# Patient Record
Sex: Female | Born: 1945 | Race: White | Hispanic: No | Marital: Married | Smoking: Never smoker
Health system: Southern US, Community
[De-identification: ages and names within clinical notes are randomized; demographics above are authoritative.]

## PROBLEM LIST (undated history)

## (undated) DIAGNOSIS — N6099 Unspecified benign mammary dysplasia of unspecified breast: Principal | ICD-10-CM

## (undated) DIAGNOSIS — I10 Essential (primary) hypertension: Secondary | ICD-10-CM

## (undated) HISTORY — PX: BREAST LUMPECTOMY: SHX2

## (undated) HISTORY — DX: Unspecified benign mammary dysplasia of unspecified breast: N60.99

## (undated) HISTORY — DX: Essential (primary) hypertension: I10

---

## 2002-05-11 ENCOUNTER — Other Ambulatory Visit: Admission: RE | Admit: 2002-05-11 | Discharge: 2002-05-11 | Payer: Self-pay | Admitting: *Deleted

## 2002-05-11 ENCOUNTER — Encounter: Admission: RE | Admit: 2002-05-11 | Discharge: 2002-05-11 | Payer: Self-pay | Admitting: *Deleted

## 2002-07-16 ENCOUNTER — Ambulatory Visit (HOSPITAL_COMMUNITY): Admission: RE | Admit: 2002-07-16 | Discharge: 2002-07-16 | Payer: Self-pay | Admitting: *Deleted

## 2002-12-03 ENCOUNTER — Observation Stay (HOSPITAL_COMMUNITY): Admission: RE | Admit: 2002-12-03 | Discharge: 2002-12-04 | Payer: Self-pay | Admitting: *Deleted

## 2002-12-03 ENCOUNTER — Encounter (INDEPENDENT_AMBULATORY_CARE_PROVIDER_SITE_OTHER): Payer: Self-pay | Admitting: Specialist

## 2003-05-30 ENCOUNTER — Encounter: Admission: RE | Admit: 2003-05-30 | Discharge: 2003-05-30 | Payer: Self-pay | Admitting: *Deleted

## 2004-06-19 ENCOUNTER — Encounter: Admission: RE | Admit: 2004-06-19 | Discharge: 2004-06-19 | Payer: Self-pay | Admitting: *Deleted

## 2005-11-11 ENCOUNTER — Encounter: Admission: RE | Admit: 2005-11-11 | Discharge: 2005-11-11 | Payer: Self-pay | Admitting: *Deleted

## 2006-06-14 ENCOUNTER — Other Ambulatory Visit: Admission: RE | Admit: 2006-06-14 | Discharge: 2006-06-14 | Payer: Self-pay | Admitting: Family Medicine

## 2006-11-16 ENCOUNTER — Encounter: Admission: RE | Admit: 2006-11-16 | Discharge: 2006-11-16 | Payer: Self-pay | Admitting: Obstetrics and Gynecology

## 2007-09-25 ENCOUNTER — Ambulatory Visit: Payer: Self-pay | Admitting: Surgery

## 2007-12-12 ENCOUNTER — Encounter: Admission: RE | Admit: 2007-12-12 | Discharge: 2007-12-12 | Payer: Self-pay | Admitting: Family Medicine

## 2007-12-26 ENCOUNTER — Encounter: Admission: RE | Admit: 2007-12-26 | Discharge: 2007-12-26 | Payer: Self-pay | Admitting: Family Medicine

## 2008-05-07 ENCOUNTER — Encounter: Admission: RE | Admit: 2008-05-07 | Discharge: 2008-07-17 | Payer: Self-pay | Admitting: Family Medicine

## 2008-07-03 ENCOUNTER — Encounter: Admission: RE | Admit: 2008-07-03 | Discharge: 2008-07-03 | Payer: Self-pay | Admitting: Family Medicine

## 2008-08-05 ENCOUNTER — Encounter: Admission: RE | Admit: 2008-08-05 | Discharge: 2008-08-05 | Payer: Self-pay | Admitting: Family Medicine

## 2008-08-20 ENCOUNTER — Encounter: Admission: RE | Admit: 2008-08-20 | Discharge: 2008-08-20 | Payer: Self-pay | Admitting: General Surgery

## 2008-08-22 ENCOUNTER — Encounter (INDEPENDENT_AMBULATORY_CARE_PROVIDER_SITE_OTHER): Payer: Self-pay | Admitting: General Surgery

## 2008-08-22 ENCOUNTER — Ambulatory Visit (HOSPITAL_BASED_OUTPATIENT_CLINIC_OR_DEPARTMENT_OTHER): Admission: RE | Admit: 2008-08-22 | Discharge: 2008-08-22 | Payer: Self-pay | Admitting: General Surgery

## 2008-08-22 ENCOUNTER — Encounter: Admission: RE | Admit: 2008-08-22 | Discharge: 2008-08-22 | Payer: Self-pay | Admitting: General Surgery

## 2009-01-29 ENCOUNTER — Encounter: Admission: RE | Admit: 2009-01-29 | Discharge: 2009-01-29 | Payer: Self-pay | Admitting: Family Medicine

## 2009-07-17 ENCOUNTER — Ambulatory Visit: Payer: Self-pay | Admitting: Oncology

## 2009-10-06 ENCOUNTER — Ambulatory Visit: Payer: Self-pay | Admitting: Oncology

## 2009-10-06 LAB — CBC WITH DIFFERENTIAL/PLATELET
BASO%: 0.3 % (ref 0.0–2.0)
Eosinophils Absolute: 0.2 10*3/uL (ref 0.0–0.5)
MCHC: 33.6 g/dL (ref 31.5–36.0)
MONO#: 0.6 10*3/uL (ref 0.1–0.9)
NEUT#: 5 10*3/uL (ref 1.5–6.5)
RBC: 4.01 10*6/uL (ref 3.70–5.45)
RDW: 13.7 % (ref 11.2–14.5)
WBC: 7.7 10*3/uL (ref 3.9–10.3)
lymph#: 1.9 10*3/uL (ref 0.9–3.3)
nRBC: 0 % (ref 0–0)

## 2009-10-06 LAB — COMPREHENSIVE METABOLIC PANEL
ALT: 24 U/L (ref 0–35)
AST: 20 U/L (ref 0–37)
CO2: 27 mEq/L (ref 19–32)
Calcium: 9.6 mg/dL (ref 8.4–10.5)
Chloride: 105 mEq/L (ref 96–112)
Creatinine, Ser: 0.88 mg/dL (ref 0.40–1.20)
Sodium: 141 mEq/L (ref 135–145)
Total Protein: 6.5 g/dL (ref 6.0–8.3)

## 2010-01-01 ENCOUNTER — Ambulatory Visit: Payer: Self-pay | Admitting: Oncology

## 2010-01-05 LAB — CBC WITH DIFFERENTIAL/PLATELET
Eosinophils Absolute: 0.1 10*3/uL (ref 0.0–0.5)
LYMPH%: 27.8 % (ref 14.0–49.7)
MONO#: 0.5 10*3/uL (ref 0.1–0.9)
NEUT#: 3.9 10*3/uL (ref 1.5–6.5)
Platelets: 287 10*3/uL (ref 145–400)
RBC: 4.09 10*6/uL (ref 3.70–5.45)
WBC: 6.2 10*3/uL (ref 3.9–10.3)
lymph#: 1.7 10*3/uL (ref 0.9–3.3)

## 2010-01-05 LAB — COMPREHENSIVE METABOLIC PANEL
ALT: 12 U/L (ref 0–35)
Albumin: 4.3 g/dL (ref 3.5–5.2)
CO2: 29 mEq/L (ref 19–32)
Calcium: 9.5 mg/dL (ref 8.4–10.5)
Chloride: 104 mEq/L (ref 96–112)
Glucose, Bld: 74 mg/dL (ref 70–99)
Potassium: 4.1 mEq/L (ref 3.5–5.3)
Sodium: 141 mEq/L (ref 135–145)
Total Bilirubin: 0.4 mg/dL (ref 0.3–1.2)
Total Protein: 6.8 g/dL (ref 6.0–8.3)

## 2010-03-16 ENCOUNTER — Encounter: Admission: RE | Admit: 2010-03-16 | Discharge: 2010-03-16 | Payer: Self-pay | Admitting: Family Medicine

## 2010-08-09 ENCOUNTER — Encounter: Payer: Self-pay | Admitting: Family Medicine

## 2010-08-10 ENCOUNTER — Encounter: Payer: Self-pay | Admitting: Family Medicine

## 2010-11-03 LAB — BASIC METABOLIC PANEL
BUN: 14 mg/dL (ref 6–23)
CO2: 28 mEq/L (ref 19–32)
Calcium: 9.8 mg/dL (ref 8.4–10.5)
Chloride: 103 mEq/L (ref 96–112)
Creatinine, Ser: 0.83 mg/dL (ref 0.4–1.2)
GFR calc Af Amer: >60 mL/min (ref >60)
GFR calc non Af Amer: >60 mL/min (ref >60)
Glucose, Bld: 110 mg/dL — ABNORMAL HIGH (ref 70–99)
Potassium: 4.2 mEq/L (ref 3.5–5.1)
Sodium: 141 mEq/L (ref 135–145)

## 2010-11-03 LAB — DIFFERENTIAL
Basophils Absolute: 0 10*3/uL (ref 0.0–0.1)
Basophils Relative: 1 % (ref 0–1)
Eosinophils Absolute: 0.1 10*3/uL (ref 0.0–0.7)
Neutro Abs: 7.1 10*3/uL (ref 1.7–7.7)
Neutrophils Relative %: 75 % (ref 43–77)

## 2010-11-03 LAB — CBC
MCHC: 34.3 g/dL (ref 30.0–36.0)
MCV: 91.2 fL (ref 78.0–100.0)
Platelets: 267 10*3/uL (ref 150–400)
RBC: 4.37 MIL/uL (ref 3.87–5.11)
WBC: 9.5 10*3/uL (ref 4.0–10.5)

## 2010-12-01 NOTE — Assessment & Plan Note (Signed)
OFFICE VISIT   [name], [name]  DOB:  27-Apr-1946                                       09/25/2007  CHART#:[id number]   REASON FOR VISIT:  Bilateral leg pain.   HISTORY:  This is a 65 year old female who I am seeing at the request of  Dr. Tania Ade for evaluation of bilateral foot pain and bilateral leg pain.  The patient states that this initially began in November and December  and has gotten worse.  She complains of soreness and achiness in the  posterior aspect of her bilateral thigh and calf.  She denies having a  significant amount of swelling.  She is aggravated by prolonged  standing.  Resting it provides relief.  She does not have any  ulcerations.  She has never had any problems like this before.   REVIEW OF SYSTEMS:  GENERAL:  Negative for fevers or chills.  CARDIAC:  Negative.  PULMONARY:  Negative.  GI:  Negative.  GU:  Negative.  VASCULAR:  Pain in the legs with walking.  NEURO:  Negative.  ORTHO:  Positive for arthritis, joint pain, and muscle pain.  PSYCH:  Negative.  ENT:  Negative.  HEME:  Negative.   PAST MEDICAL HISTORY:  Significant for hypertension and  hypercholesterolemia.   FAMILY HISTORY:  Noncontributory.   SOCIAL HISTORY:  She is married with two children.  She has a history of  smoking but does not currently smoke.  She does not drink.   MEDICATIONS:  1. Meloxicam 15 mg per day.  2. Crestor 10 mg per day.  3. Vitamin D 100 IU once daily.  4. Multivitamin.  5. Glucosamine.  6. Aspirin 81 mg per day.  7. Tart black cherry 100 mg twice daily.   ALLERGIES:  None.   PHYSICAL EXAMINATION:  Vital signs:  Heart rate is 85.  Blood pressure  is 128/85.  Respirations 16.  O2 sats are 98%.  General:  She is well-  appearing in no acute distress.  HEENT:  Normocephalic and atraumatic.  Cardiovascular:  Regular rate.  Pulmonary:  Respirations are nonlabored.  Abdomen is obese.  Extremities: Warm and well perfused.  She  has  palpable pedal pulses.  There are some telangiectasias.  No prominent  varicosities.  Minimal edema.  No ulceration.   DIAGNOSTIC STUDIES:  Patient had arterial duplex which shows no abnormal  arterial flow.  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  The saphenofemoral junction is competent bilaterally.   ASSESSMENT:  Bilateral leg pain.   PLAN:  By ultrasound, the patient has no evidence of arterial  insufficiency.  I did proceed with a venous duplex, even though she does  not have a significant amount of swelling, she does have some stigmata  of venous insufficiency.  On ultrasound, she was found to have  incompetent perforators in the distal aspect of the bilateral lower  extremity, left greater than right.  I do not think that this has  explained the full extent of her symptoms; however, it may be  contributing.  I have recommended that she be put in 20-30 mm  compression hose for a trial period of three months.  If she has benefit  from this, we can proceed with possibly venous ablation.  However, I do  not feel that this is the most likely culprit for her symptoms,  especially given that her venous disease is in her lower leg and she  experiences discomfort in the posterior aspect of her thighs as well as  her lower legs.  I will plan on seeing the patient back in three months  for further evaluation.   Jorge Ny, MD  Electronically Signed   VWB/MEDQ  D:  09/25/2007  T:  09/26/2007  Job:  [id number]   cc:   Dr. Belva Agee

## 2010-12-01 NOTE — Op Note (Signed)
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   POSTOPERATIVE DIAGNOSIS:  Left breast atypical ductal hyperplasia on  core biopsy.   PROCEDURE:  Left breast wire localization biopsy.   SURGEON:  Juanetta Gosling, MD   ASSISTANT:  None.   ANESTHESIA:  General.   FINDINGS:  Mammographic confirmation of clip removal.   SPECIMENS:  Left breast mass oriented to pathology.   ESTIMATED BLOOD LOSS:  Minimal.   COMPLICATIONS:  None.   DRAINS:  None.   DISPOSITION:  To recovery room in stable condition.   INDICATIONS:  [name] is a 65 year old female who 6 months ago  had a BI-RADS 3 mammogram.  She returned for followup and interval  decrease in the number of calcifications, and it was determined to be BI-  RADS 4.  This area was about 0.9 cm in size.  On biopsy, this showed  atypical ductal hyperplasia with calcifications.  She was referred to me  for evaluation for surgical excision of this area.  We discussed a left  breast wire localization biopsy.   PROCEDURE:  After informed consent was obtained, the patient first had a  wire placed in the lesion by Dr. Christiana Pellant.  I had the mammograms  in her report available to me prior to the procedure.  She was  administered 1 g of intravenous cefazolin.  She was then taken to the  operating room and placed under general anesthesia without complication.  Her left breast was then prepped and draped in a standard sterile  surgical fashion. A surgical time-out was then performed.   A curvilinear incision was then made in the upper outer  quadrant of her  left breast.  Dissection was carried out down to the level of the wire.  This was then pulled into the wound.  Electrocautery was then used to  remove the breast surrounding the wire and this was then removed.  This  was marked with  sutures for orientation.  Mammogram was taken in the  operating room with the Faxitron confirming the removal of the clip.  The Faxitron was not working and I was unable to send a picture to Dr.  Chilton Si to evaluate, but I was confident that I removed the clip and the  specimen and then chose not to keep her under anesthesia for a prolonged  period of time due to this.  This later was confirmed.  Hemostasis was  observed.  I then closed her dermis with a 4-0 Vicryl, the skin with a 4-  0 Monocryl in a subcuticular fashion.  Steri-Strips and sterile dressing  were placed over this.  A 10 mL of 0.25% Marcaine was infiltrated in the  wound after this.  She  tolerated this well and was extubated in the  operating room and transferred to the recovery room in stable condition.      Juanetta Gosling, MD  Electronically Signed     MCW/MEDQ  D:  08/22/2008  T:  08/23/2008  Job:  [id number]   cc:   Ernestina Penna, M.D.  Harrel Lemon, MD

## 2010-12-01 NOTE — Procedures (Signed)
LOWER EXTREMITY VENOUS REFLUX EXAM   INDICATION:  Lower extremity pain.   EXAM:  Using color-flow imaging and pulse Doppler spectral analysis, the  right and left common femoral, superficial femoral, popliteal, posterior  tibial, greater and lesser saphenous veins are evaluated.  There is no  evidence suggesting deep venous insufficiency in the right and left  lower extremities.   The right and left saphenofemoral junctions are competent.  The right  and left GSV are competent in the thigh with the left incompetent in the  calf.   The right and left proximal short saphenous veins demonstrate  competency.   GSV Diameter (used if found to be incompetent only)                                            Right    Left  Proximal Greater Saphenous Vein           cm       cm  Proximal-to-mid-thigh                     cm       cm  Mid thigh                                 cm       cm  Mid-distal thigh                          cm       cm  Distal thigh                              cm       cm  Knee                                      cm       cm   IMPRESSION:  1. The right and left greater saphenous veins show no reflux from knee      to groin; however, the left does show incompetence in the calf.  2. The right and left greater saphenous veins are not aneurysmal.  3. The right greater saphenous vein is not tortuous in the thigh, the      left is tortuous and moves out of the fascia mid-thigh.  4. The deep venous system is competent.  5. The right and left lesser saphenous veins are competent.  6. There is evidence of perforator incompetence in the right distal      calf and three incompetent perforators in the left mid-to-distal      calf.   ___________________________________________  V. Charlena Cross, MD   AS/MEDQ  D:  09/25/2007  T:  09/25/2007  Job:  [id number]

## 2011-02-04 ENCOUNTER — Other Ambulatory Visit: Payer: Self-pay | Admitting: Oncology

## 2011-02-04 ENCOUNTER — Encounter (HOSPITAL_BASED_OUTPATIENT_CLINIC_OR_DEPARTMENT_OTHER): Payer: PRIVATE HEALTH INSURANCE | Admitting: Oncology

## 2011-02-04 DIAGNOSIS — Z803 Family history of malignant neoplasm of breast: Secondary | ICD-10-CM

## 2011-02-04 LAB — CBC WITH DIFFERENTIAL/PLATELET
Basophils Absolute: 0 10*3/uL (ref 0.0–0.1)
Eosinophils Absolute: 0.2 10*3/uL (ref 0.0–0.5)
HGB: 13.2 g/dL (ref 11.6–15.9)
MONO#: 0.6 10*3/uL (ref 0.1–0.9)
MONO%: 8.1 % (ref 0.0–14.0)
NEUT#: 4.5 10*3/uL (ref 1.5–6.5)
RBC: 4.16 10*6/uL (ref 3.70–5.45)
RDW: 13.6 % (ref 11.2–14.5)
WBC: 8 10*3/uL (ref 3.9–10.3)
lymph#: 2.6 10*3/uL (ref 0.9–3.3)

## 2011-02-04 LAB — COMPREHENSIVE METABOLIC PANEL
Albumin: 4.1 g/dL (ref 3.5–5.2)
Alkaline Phosphatase: 39 U/L (ref 39–117)
CO2: 28 mEq/L (ref 19–32)
Calcium: 9.2 mg/dL (ref 8.4–10.5)
Chloride: 106 mEq/L (ref 96–112)
Glucose, Bld: 87 mg/dL (ref 70–99)
Potassium: 4 mEq/L (ref 3.5–5.3)
Sodium: 140 mEq/L (ref 135–145)
Total Protein: 6.4 g/dL (ref 6.0–8.3)

## 2011-02-11 ENCOUNTER — Encounter (HOSPITAL_BASED_OUTPATIENT_CLINIC_OR_DEPARTMENT_OTHER): Payer: PRIVATE HEALTH INSURANCE | Admitting: Oncology

## 2011-02-11 DIAGNOSIS — N6089 Other benign mammary dysplasias of unspecified breast: Secondary | ICD-10-CM

## 2011-02-11 DIAGNOSIS — Z803 Family history of malignant neoplasm of breast: Secondary | ICD-10-CM

## 2011-03-30 ENCOUNTER — Other Ambulatory Visit: Payer: Self-pay | Admitting: Internal Medicine

## 2011-03-30 DIAGNOSIS — Z1231 Encounter for screening mammogram for malignant neoplasm of breast: Secondary | ICD-10-CM

## 2011-04-13 ENCOUNTER — Other Ambulatory Visit: Payer: Self-pay | Admitting: Oncology

## 2011-04-13 ENCOUNTER — Ambulatory Visit
Admission: RE | Admit: 2011-04-13 | Discharge: 2011-04-13 | Disposition: A | Discharge status: Home / Self Care | Payer: PRIVATE HEALTH INSURANCE | Source: Ambulatory Visit | Attending: Internal Medicine | Admitting: Internal Medicine

## 2011-04-13 DIAGNOSIS — Z1231 Encounter for screening mammogram for malignant neoplasm of breast: Secondary | ICD-10-CM

## 2011-06-24 ENCOUNTER — Telehealth: Payer: Self-pay | Admitting: *Deleted

## 2011-06-24 NOTE — Telephone Encounter (Signed)
patient confirmed over the phone the new date and tim on 02-11-2012 starting at 2:00pm

## 2011-08-19 ENCOUNTER — Encounter: Payer: Self-pay | Admitting: *Deleted

## 2011-08-19 ENCOUNTER — Telehealth: Payer: Self-pay | Admitting: *Deleted

## 2011-08-19 ENCOUNTER — Other Ambulatory Visit: Payer: Self-pay | Admitting: Oncology

## 2011-08-19 MED ORDER — TAMOXIFEN CITRATE 20 MG PO TABS: 20.0000 mg | ORAL_TABLET | Freq: Every day | ORAL | Status: DC

## 2011-08-19 NOTE — Progress Notes (Unsigned)
Spoke with Pt who states " I am in the middle of buying a house at [facility], [place] and I cant see Dr. Welton Flakes on 08/24/11. I wont be back until 02/08/12 when my appt is already scheduled. I cannot afford Evista due to no insurance and cost is 200$ month. " Pt has requested Generic.  Spoke with [facility] at [facility] [phone] and there is no Generic for Evista. Recommended Boniva cost is approx 115.59$ month or Faosamax at 9$ month S/W Jill [facility] at [facility] who recommended Fosamax as an alternative also.  Will review with MD

## 2011-08-19 NOTE — Telephone Encounter (Signed)
Spoke with Pt who states " I am in the middle of buying a house at [facility], [place] and I cant see Dr. Welton Flakes on 08/24/11. I wont be back until 02/08/12 when my appt is already scheduled. I cannot afford Evista due to no insurance and cost is 200$ month. " Pt has requested Generic.  Spoke with [facility] at [facility] [phone] and there is no Generic for Evista. Recommended Boniva cost is approx 115.59$ month or Faosamax at 9$ month S/W Jill [facility] at [facility] who recommended Fosamax as an alternative also.  Will review with MD    Onc Tx Schedule  Sent to cancel pt appt 08/24/11 per pt request

## 2011-08-19 NOTE — Telephone Encounter (Signed)
patient confirmed appointment for 10-01-2011 over the phone on 08-19-2011

## 2011-08-19 NOTE — Telephone Encounter (Signed)
patient confirmed over the phone the new date and time  on 09-2011 

## 2011-08-19 NOTE — Telephone Encounter (Signed)
Pt called with request to take Generic of Evista as she does not have insurance and cannot afford Evista 60mg  daily. Reviewed with MD- VO ok for pt to start generic of Evista- Raloxifene 60mg  Daily. Pt to see MD on 08/24/11 at 11:30.  Onc Tx Schd sent

## 2011-08-19 NOTE — Telephone Encounter (Signed)
per patient will not be back into [place] until march 2013

## 2011-08-19 NOTE — Telephone Encounter (Signed)
Patient call my nurse earlier today regarding the use of Evista. She has just lost her insurance and Evista is called costing her $200 a month for her  prescription. She she wanted to go to a generic form. Unfortunately Evista does not exist in the generic form. Patient is quite aware of this.  I spoke to the patient just now she is being treated with Evista for chemoprevention and not for osteoporosis. Her other option if she cannot afford Evista would be tamoxifen 20 mg daily. His consent benefits of tamoxifen were discussed with the patient and very detail including but not limited to endometrial carcinoma DVTs hot flashes liver problems etc. Patient understands and she certainly would like to switch to tamoxifen if it is going to cause to her last period I will have my nurse sent in a prescription to her pharmacy in [place]. Patient will [place] coming back to [place] in may of March and she will need to be seen at that time. I will send a electronic by mouth well to the schedulers. Patient will also call us if any questions or concerns arise.  Drue Second, MD Medical/Oncology [facility] [phone] (beeper) [phone] (Office)  08/19/2011, 5:29 PM

## 2011-08-20 ENCOUNTER — Other Ambulatory Visit: Payer: Self-pay

## 2011-08-20 DIAGNOSIS — C50919 Malignant neoplasm of unspecified site of unspecified female breast: Secondary | ICD-10-CM

## 2011-08-20 MED ORDER — TAMOXIFEN CITRATE 20 MG PO TABS: 20.0000 mg | ORAL_TABLET | Freq: Every day | ORAL | Status: AC

## 2011-08-24 ENCOUNTER — Ambulatory Visit: Payer: PRIVATE HEALTH INSURANCE | Admitting: Oncology

## 2011-09-16 ENCOUNTER — Other Ambulatory Visit: Payer: Self-pay | Admitting: *Deleted

## 2011-09-30 ENCOUNTER — Encounter: Payer: Self-pay | Admitting: *Deleted

## 2011-10-01 ENCOUNTER — Encounter: Payer: Self-pay | Admitting: Oncology

## 2011-10-01 ENCOUNTER — Ambulatory Visit (HOSPITAL_BASED_OUTPATIENT_CLINIC_OR_DEPARTMENT_OTHER): Payer: Self-pay | Admitting: Oncology

## 2011-10-01 ENCOUNTER — Telehealth: Payer: Self-pay | Admitting: Oncology

## 2011-10-01 VITALS — BP 138/78 | HR 66 | Temp 98.5°F | Ht 69.0 in | Wt 232.3 lb

## 2011-10-01 DIAGNOSIS — N6099 Unspecified benign mammary dysplasia of unspecified breast: Secondary | ICD-10-CM

## 2011-10-01 DIAGNOSIS — N6089 Other benign mammary dysplasias of unspecified breast: Secondary | ICD-10-CM

## 2011-10-01 HISTORY — DX: Unspecified benign mammary dysplasia of unspecified breast: N60.99

## 2011-10-01 NOTE — Telephone Encounter (Signed)
Last note entered under the wrong pt for 10/01/2011 gve the pt her oct 2013 appt calendar along with the mammo appt

## 2011-10-01 NOTE — Telephone Encounter (Signed)
gve the pt her April-oct 2013 appt calendar along with the ct scan appt with instructions

## 2011-10-01 NOTE — Patient Instructions (Signed)
1. You are doing well with the tamoxifen.  2. You are due for mammogram in October andI have ordered it for 04/18/12 as well as blood work.  3. I will see you back in April 21, 2012

## 2011-10-06 NOTE — Progress Notes (Signed)
OFFICE PROGRESS NOTE  CC Dr. Emelia Loron Dr. Rudi Heap  DIAGNOSIS: 66 year old female with atypical ductal hyperplasia of the left breast status post lumpectomy by Dr. Emelia Loron  PRIOR THERAPY:  #1 patient underwent a lumpectomy in [place] 2010 that showed an atypical ductal hyperplasia of the left breast.  #2 she was then started on Evista 60 mg daily unfortunately due to the cost patient could not afford this.  #3 patient was then started on tamoxifen 20 mg daily as a chemopreventive starting in February 2013.  CURRENT THERAPY:tamoxifen 20 mg daily for a duration of 5 years  INTERVAL HISTORY: Sheri [name] 66 y.o. female returns for followup visit. Clinically she seems to be doing well she is tolerating the tamoxifen well and has no ongoing side effects except for some hot flashes. She is denying any nausea vomiting fevers chills night sweats headaches she has no shortness of breath no chest pains no palpitations. 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 However she will continue to see [place] here every 6 months. Remainder of the 10 point review of systems is negative.  MEDICAL HISTORY: Past Medical History  Diagnosis Date  . Atypical ductal hyperplasia of breast   . Hypertension   . Atypical ductal hyperplasia of breast 10/01/2011    ALLERGIES:   has no known allergies.  MEDICATIONS:  Current Outpatient Prescriptions  Medication Sig Dispense Refill  . aspirin 81 MG tablet Take 81 mg by mouth daily.      . ASTAXANTHIN PO Take by mouth.      . calcium citrate (CALCITRATE - DOSED IN MG ELEMENTAL CALCIUM) 950 MG tablet Take 1 tablet by mouth daily.      . celecoxib (CELEBREX) 200 MG capsule Take 200 mg by mouth daily.      . cholecalciferol (VITAMIN D) 400 UNITS TABS Take 2,000 Units by mouth daily.      Marland Kitchen escitalopram (LEXAPRO) 20 MG tablet Take 20 mg by mouth daily.      . Fenofibrate 150 MG CAPS Take by mouth.       . moexipril-hydrochlorothiazide (UNIRETIC) 15-25 MG per tablet Take 1 tablet by mouth daily.      . rosuvastatin (CRESTOR) 10 MG tablet Take 10 mg by mouth daily.      . tamoxifen (NOLVADEX) 20 MG tablet Take 20 mg by mouth daily.        SURGICAL HISTORY:  Past Surgical History  Procedure Date  . Breast lumpectomy     REVIEW OF SYSTEMS:  Pertinent items are noted in HPI.   PHYSICAL EXAMINATION: General appearance: alert, cooperative and appears stated age Neck: no adenopathy, no carotid bruit, no JVD, supple, symmetrical, trachea midline and thyroid not enlarged, symmetric, no tenderness/mass/nodules Lymph nodes: Cervical, supraclavicular, and axillary nodes normal. Resp: clear to auscultation bilaterally Back: symmetric, no curvature. ROM normal. No CVA tenderness. Cardio: regular rate and rhythm, S1, S2 normal, no murmur, click, rub or gallop GI: soft, non-tender; bowel sounds normal; no masses,  no organomegaly Extremities: extremities normal, atraumatic, no cyanosis or edema Neurologic: Grossly normal Bilateral breast examination is performed there are no masses or nipple discharge or skin changes in the right breast left breast reveals a well-healed lumpectomy scar it is barely visible there are no masses or nipple discharge. ECOG PERFORMANCE STATUS: 0 - Asymptomatic  Blood pressure 138/78, pulse 66, temperature 98.5 F (36.9 C), temperature source Oral, height 5\' 9"  (1.753 m), weight  232 lb 4.8 oz (105.371 kg).  LABORATORY DATA: Lab Results  Component Value Date   WBC 8.0 02/04/2011   HGB 13.2 02/04/2011   HCT 38.9 02/04/2011   MCV 93.6 02/04/2011   PLT 272 02/04/2011      Chemistry      Component Value Date/Time   NA 140 02/04/2011 1418   NA 140 02/04/2011 1418   K 4.0 02/04/2011 1418   K 4.0 02/04/2011 1418   CL 106 02/04/2011 1418   CL 106 02/04/2011 1418   CO2 28 02/04/2011 1418   CO2 28 02/04/2011 1418   BUN 20 02/04/2011 1418   BUN 20 02/04/2011 1418   CREATININE  0.87 02/04/2011 1418   CREATININE 0.87 02/04/2011 1418      Component Value Date/Time   CALCIUM 9.2 02/04/2011 1418   CALCIUM 9.2 02/04/2011 1418   ALKPHOS 39 02/04/2011 1418   ALKPHOS 39 02/04/2011 1418   AST 16 02/04/2011 1418   AST 16 02/04/2011 1418   ALT 11 02/04/2011 1418   ALT 11 02/04/2011 1418   BILITOT 0.4 02/04/2011 1418   BILITOT 0.4 02/04/2011 1418       RADIOGRAPHIC STUDIES:  No results found.  ASSESSMENT: 66 year old female with  #1 atypical ductal hyperplasia of the left breast status post lumpectomy performed in February 2010 by Dr. Emelia Loron.  #2 patient is on tamoxifen 20 mg daily as a chemopreventive and she is tolerating this very well.   PLAN:   #1 we will continue tamoxifen on a daily basis for a total of 5 years.  #2 patient knows to call me with any problems questions or concerns.   All questions were answered. The patient knows to call the clinic with any problems, questions or concerns. We can certainly see the patient much sooner if necessary.  I spent 25 minutes counseling the patient face to face. The total time spent in the appointment was 30 minutes.    Drue Second, MD Medical/Oncology [facility] [phone] (beeper) [phone] (Office)  10/06/2011, 6:10 PM

## 2012-02-11 ENCOUNTER — Ambulatory Visit: Payer: PRIVATE HEALTH INSURANCE | Admitting: Oncology

## 2012-02-11 ENCOUNTER — Other Ambulatory Visit: Payer: PRIVATE HEALTH INSURANCE | Admitting: Lab

## 2012-04-18 ENCOUNTER — Ambulatory Visit
Admission: RE | Admit: 2012-04-18 | Discharge: 2012-04-18 | Disposition: A | Discharge status: Home / Self Care | Payer: Self-pay | Source: Ambulatory Visit | Attending: Oncology | Admitting: Oncology

## 2012-04-18 ENCOUNTER — Other Ambulatory Visit: Payer: Self-pay | Admitting: Oncology

## 2012-04-18 DIAGNOSIS — N6099 Unspecified benign mammary dysplasia of unspecified breast: Secondary | ICD-10-CM

## 2012-04-21 ENCOUNTER — Telehealth: Payer: Self-pay | Admitting: *Deleted

## 2012-04-21 ENCOUNTER — Encounter: Payer: Self-pay | Admitting: Oncology

## 2012-04-21 ENCOUNTER — Ambulatory Visit (HOSPITAL_BASED_OUTPATIENT_CLINIC_OR_DEPARTMENT_OTHER): Payer: Self-pay | Admitting: Oncology

## 2012-04-21 ENCOUNTER — Other Ambulatory Visit (HOSPITAL_BASED_OUTPATIENT_CLINIC_OR_DEPARTMENT_OTHER): Payer: Self-pay | Admitting: Lab

## 2012-04-21 VITALS — BP 158/92 | HR 69 | Temp 98.2°F | Resp 20 | Ht 69.0 in | Wt 231.8 lb

## 2012-04-21 DIAGNOSIS — N6099 Unspecified benign mammary dysplasia of unspecified breast: Secondary | ICD-10-CM

## 2012-04-21 DIAGNOSIS — N6089 Other benign mammary dysplasias of unspecified breast: Secondary | ICD-10-CM

## 2012-04-21 LAB — COMPREHENSIVE METABOLIC PANEL (CC13)
Albumin: 3.6 g/dL (ref 3.5–5.0)
BUN: 23 mg/dL (ref 7.0–26.0)
CO2: 24 mEq/L (ref 22–29)
Calcium: 8.8 mg/dL (ref 8.4–10.4)
Chloride: 106 mEq/L (ref 98–107)
Glucose: 110 mg/dl — ABNORMAL HIGH (ref 70–99)
Potassium: 4 mEq/L (ref 3.5–5.1)
Sodium: 139 mEq/L (ref 136–145)
Total Protein: 6.2 g/dL — ABNORMAL LOW (ref 6.4–8.3)

## 2012-04-21 LAB — CBC WITH DIFFERENTIAL/PLATELET
Basophils Absolute: 0 10*3/uL (ref 0.0–0.1)
Eosinophils Absolute: 0.2 10*3/uL (ref 0.0–0.5)
HCT: 36.1 % (ref 34.8–46.6)
HGB: 12.4 g/dL (ref 11.6–15.9)
LYMPH%: 29.1 % (ref 14.0–49.7)
MCV: 96.9 fL (ref 79.5–101.0)
MONO#: 0.5 10*3/uL (ref 0.1–0.9)
MONO%: 7.7 % (ref 0.0–14.0)
NEUT#: 3.7 10*3/uL (ref 1.5–6.5)
Platelets: 195 10*3/uL (ref 145–400)
WBC: 6.2 10*3/uL (ref 3.9–10.3)

## 2012-04-21 NOTE — Telephone Encounter (Signed)
Gave patient appointment for 10-20-2012 starting at 10:30am 

## 2012-04-21 NOTE — Patient Instructions (Addendum)
Continue tamoxifen as prescribed  I will see you back in 6 months

## 2012-04-21 NOTE — Telephone Encounter (Signed)
Gave patient appointment for 10-20-2012 starting at 10:30am

## 2012-04-21 NOTE — Progress Notes (Signed)
I check the patient in this morning and I notice that she does not have any insurance.I ask her if she would like to talk with the financial advocate and she said no.

## 2012-04-21 NOTE — Progress Notes (Signed)
OFFICE PROGRESS NOTE  CC Dr. Emelia Loron Dr. Rudi Heap  DIAGNOSIS: 66 year old female with atypical ductal hyperplasia of the left breast status post lumpectomy by Dr. Emelia Loron  PRIOR THERAPY:  #1 patient underwent a lumpectomy in [place] 2010 that showed an atypical ductal hyperplasia of the left breast.  #2 she was then started on Evista 60 mg daily unfortunately due to the cost patient could not afford this.  #3 patient was then started on tamoxifen 20 mg daily as a chemopreventive starting in February 2013.  CURRENT THERAPY:tamoxifen 20 mg daily for a duration of 5 years  INTERVAL HISTORY: Sheri [name] 66 y.o. female returns for followup visit. Clinically she seems to be doing well she is tolerating the tamoxifen well and has no ongoing side effects except for some hot flashes. She is denying any nausea vomiting fevers chills night sweats headaches she has no shortness of breath no chest pains no palpitations. 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 However she will continue to see [place] here every 6 months. Remainder of the 10 point review of systems is negative.  MEDICAL HISTORY: Past Medical History  Diagnosis Date  . Atypical ductal hyperplasia of breast   . Hypertension   . Atypical ductal hyperplasia of breast 10/01/2011    ALLERGIES:   has no known allergies.  MEDICATIONS:  Current Outpatient Prescriptions  Medication Sig Dispense Refill  . aspirin 81 MG tablet Take 162 mg by mouth daily.       . calcium citrate (CALCITRATE - DOSED IN MG ELEMENTAL CALCIUM) 950 MG tablet Take 1 tablet by mouth daily.      . cholecalciferol (VITAMIN D) 400 UNITS TABS Take 2,000 Units by mouth daily.      . meloxicam (MOBIC) 7.5 MG tablet Take 7.5 mg by mouth daily.      . moexipril-hydrochlorothiazide (UNIRETIC) 15-25 MG per tablet Take 0.5 tablets by mouth daily.       . simvastatin (ZOCOR) 20 MG tablet Take 20 mg by  mouth every evening.      . tamoxifen (NOLVADEX) 20 MG tablet Take 20 mg by mouth daily.        SURGICAL HISTORY:  Past Surgical History  Procedure Date  . Breast lumpectomy     REVIEW OF SYSTEMS:  Pertinent items are noted in HPI.   PHYSICAL EXAMINATION: General appearance: alert, cooperative and appears stated age Neck: no adenopathy, no carotid bruit, no JVD, supple, symmetrical, trachea midline and thyroid not enlarged, symmetric, no tenderness/mass/nodules Lymph nodes: Cervical, supraclavicular, and axillary nodes normal. Resp: clear to auscultation bilaterally Back: symmetric, no curvature. ROM normal. No CVA tenderness. Cardio: regular rate and rhythm, S1, S2 normal, no murmur, click, rub or gallop GI: soft, non-tender; bowel sounds normal; no masses,  no organomegaly Extremities: extremities normal, atraumatic, no cyanosis or edema Neurologic: Grossly normal Bilateral breast examination is performed there are no masses or nipple discharge or skin changes in the right breast left breast reveals a well-healed lumpectomy scar it is barely visible there are no masses or nipple discharge. ECOG PERFORMANCE STATUS: 0 - Asymptomatic  Blood pressure 158/92, pulse 69, temperature 98.2 F (36.8 C), resp. rate 20, height 5\' 9"  (1.753 m), weight 231 lb 12.8 oz (105.144 kg).  LABORATORY DATA: Lab Results  Component Value Date   WBC 6.2 04/21/2012   HGB 12.4 04/21/2012   HCT 36.1 04/21/2012   MCV 96.9 04/21/2012   PLT  195 04/21/2012      Chemistry      Component Value Date/Time   NA 139 04/21/2012 0956   NA 140 02/04/2011 1418   NA 140 02/04/2011 1418   K 4.0 04/21/2012 0956   K 4.0 02/04/2011 1418   K 4.0 02/04/2011 1418   CL 106 04/21/2012 0956   CL 106 02/04/2011 1418   CL 106 02/04/2011 1418   CO2 24 04/21/2012 0956   CO2 28 02/04/2011 1418   CO2 28 02/04/2011 1418   BUN 23.0 04/21/2012 0956   BUN 20 02/04/2011 1418   BUN 20 02/04/2011 1418   CREATININE 0.8 04/21/2012 0956    CREATININE 0.87 02/04/2011 1418   CREATININE 0.87 02/04/2011 1418      Component Value Date/Time   CALCIUM 8.8 04/21/2012 0956   CALCIUM 9.2 02/04/2011 1418   CALCIUM 9.2 02/04/2011 1418   ALKPHOS 51 04/21/2012 0956   ALKPHOS 39 02/04/2011 1418   ALKPHOS 39 02/04/2011 1418   AST 26 04/21/2012 0956   AST 16 02/04/2011 1418   AST 16 02/04/2011 1418   ALT 20 04/21/2012 0956   ALT 11 02/04/2011 1418   ALT 11 02/04/2011 1418   BILITOT 0.50 04/21/2012 0956   BILITOT 0.4 02/04/2011 1418   BILITOT 0.4 02/04/2011 1418       RADIOGRAPHIC STUDIES:  No results found.  ASSESSMENT: 66 year old female with  #1 atypical ductal hyperplasia of the left breast status post lumpectomy performed in February 2010 by Dr. Emelia Loron.  #2 patient is on tamoxifen 20 mg daily as a chemopreventive and she is tolerating this very well.   PLAN:   #1 we will continue tamoxifen on a daily basis for a total of 5 years.  #2 patient knows to call me with any problems questions or concerns.   All questions were answered. The patient knows to call the clinic with any problems, questions or concerns. We can certainly see the patient much sooner if necessary.  I spent 25 minutes counseling the patient face to face. The total time spent in the appointment was 30 minutes.    Drue Second, MD Medical/Oncology [facility] [phone] (beeper) [phone] (Office)  04/21/2012, 11:03 AM

## 2012-09-28 ENCOUNTER — Other Ambulatory Visit: Payer: Self-pay | Admitting: *Deleted

## 2012-09-28 MED ORDER — TAMOXIFEN CITRATE 20 MG PO TABS: 20.0000 mg | ORAL_TABLET | Freq: Every day | ORAL | Status: AC

## 2012-10-20 ENCOUNTER — Ambulatory Visit (HOSPITAL_BASED_OUTPATIENT_CLINIC_OR_DEPARTMENT_OTHER): Payer: Self-pay | Admitting: Oncology

## 2012-10-20 ENCOUNTER — Telehealth: Payer: Self-pay | Admitting: Oncology

## 2012-10-20 ENCOUNTER — Other Ambulatory Visit (HOSPITAL_BASED_OUTPATIENT_CLINIC_OR_DEPARTMENT_OTHER): Payer: Self-pay | Admitting: Lab

## 2012-10-20 ENCOUNTER — Encounter: Payer: Self-pay | Admitting: Oncology

## 2012-10-20 VITALS — BP 159/89 | HR 100 | Temp 98.3°F | Resp 20 | Ht 69.0 in | Wt 228.5 lb

## 2012-10-20 DIAGNOSIS — N6089 Other benign mammary dysplasias of unspecified breast: Secondary | ICD-10-CM

## 2012-10-20 DIAGNOSIS — N6099 Unspecified benign mammary dysplasia of unspecified breast: Secondary | ICD-10-CM

## 2012-10-20 LAB — CBC WITH DIFFERENTIAL/PLATELET
Eosinophils Absolute: 0.2 10*3/uL (ref 0.0–0.5)
MCV: 93.5 fL (ref 79.5–101.0)
MONO#: 0.5 10*3/uL (ref 0.1–0.9)
MONO%: 6.9 % (ref 0.0–14.0)
NEUT#: 5.1 10*3/uL (ref 1.5–6.5)
RBC: 3.99 10*6/uL (ref 3.70–5.45)
RDW: 12.8 % (ref 11.2–14.5)
WBC: 7.7 10*3/uL (ref 3.9–10.3)
lymph#: 1.9 10*3/uL (ref 0.9–3.3)

## 2012-10-20 LAB — COMPREHENSIVE METABOLIC PANEL (CC13)
Albumin: 3.3 g/dL — ABNORMAL LOW (ref 3.5–5.0)
Alkaline Phosphatase: 50 U/L (ref 40–150)
Calcium: 8.9 mg/dL (ref 8.4–10.4)
Chloride: 104 mEq/L (ref 98–107)
Glucose: 138 mg/dl — ABNORMAL HIGH (ref 70–99)
Potassium: 3.9 mEq/L (ref 3.5–5.1)
Sodium: 139 mEq/L (ref 136–145)
Total Protein: 6.4 g/dL (ref 6.4–8.3)

## 2012-10-20 NOTE — Patient Instructions (Addendum)
Continue tamoxifen 20 mg daily  I will see you back in 1 year 

## 2012-10-20 NOTE — Progress Notes (Signed)
OFFICE PROGRESS NOTE  CC Dr. Emelia Loron Dr. 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 Emelia Loron  PRIOR THERAPY:  #1 patient underwent a lumpectomy in [place] 2010 that showed an atypical ductal hyperplasia of the left breast.  #2 she was then started on Evista 60 mg daily unfortunately due to the cost patient could not afford this.  #3 patient was then started on tamoxifen 20 mg daily as a chemopreventive starting in February 2013.  CURRENT THERAPY:tamoxifen 20 mg daily for a duration of 5 years  INTERVAL HISTORY: Sheri [name] 67 y.o. female returns for followup visit. Clinically she seems to be doing well she is tolerating the tamoxifen well and has no ongoing side effects except for some hot flashes. She is denying any nausea vomiting fevers chills night sweats headaches she has no shortness of breath no chest pains no palpitations. Patient has moved to [facility] near [place] and she is setting up home care. However she will continue to see [place] here every 6 months. Remainder of the 10 point review of systems is negative.  MEDICAL HISTORY: Past Medical History  Diagnosis Date  . Atypical ductal hyperplasia of breast   . Hypertension   . Atypical ductal hyperplasia of breast 10/01/2011    ALLERGIES:  has No Known Allergies.  MEDICATIONS:  Current Outpatient Prescriptions  Medication Sig Dispense Refill  . aspirin 81 MG tablet Take 162 mg by mouth daily.       . calcium citrate (CALCITRATE - DOSED IN MG ELEMENTAL CALCIUM) 950 MG tablet Take 1 tablet by mouth daily.      . celecoxib (CELEBREX) 200 MG capsule Take 200 mg by mouth daily.      . cholecalciferol (VITAMIN D) 400 UNITS TABS Take 1,000 Units by mouth daily.       Boris Lown Oil 500 MG CAPS Take by mouth daily.      . moexipril-hydrochlorothiazide (UNIRETIC) 15-25 MG per tablet Take 0.5 tablets by mouth daily.        Marland Kitchen pyridOXINE (VITAMIN B-6) 100 MG tablet Take 100 mg by mouth daily.      . simvastatin (ZOCOR) 20 MG tablet Take 20 mg by mouth every evening.      . tamoxifen (NOLVADEX) 20 MG tablet Take 1 tablet (20 mg total) by mouth daily.  90 tablet  0  . vitamin B-12 (CYANOCOBALAMIN) 1000 MCG tablet Take 1,000 mcg by mouth daily.       No current facility-administered medications for this visit.    SURGICAL HISTORY:  Past Surgical History  Procedure Laterality Date  . Breast lumpectomy      REVIEW OF SYSTEMS:  Pertinent items are noted in HPI.   PHYSICAL EXAMINATION: General appearance: alert, cooperative and appears stated age Neck: no adenopathy, no carotid bruit, no JVD, supple, symmetrical, trachea midline and thyroid not enlarged, symmetric, no tenderness/mass/nodules Lymph nodes: Cervical, supraclavicular, and axillary nodes normal. Resp: clear to auscultation bilaterally Back: symmetric, no curvature. ROM normal. No CVA tenderness. Cardio: regular rate and rhythm, S1, S2 normal, no murmur, click, rub or gallop GI: soft, non-tender; bowel sounds normal; no masses,  no organomegaly Extremities: extremities normal, atraumatic, no cyanosis or edema Neurologic: Grossly normal Bilateral breast examination is performed there are no masses or nipple discharge or skin changes in the right breast left breast reveals a well-healed lumpectomy scar it is barely visible there are no masses or  nipple discharge. ECOG PERFORMANCE STATUS: 0 - Asymptomatic  Blood pressure 159/89, pulse 100, temperature 98.3 F (36.8 C), temperature source Oral, resp. rate 20, height 5\' 9"  (1.753 m), weight 228 lb 8 oz (103.647 kg).  LABORATORY DATA: Lab Results  Component Value Date   WBC 7.7 10/20/2012   HGB 12.8 10/20/2012   HCT 37.3 10/20/2012   MCV 93.5 10/20/2012   PLT 223 10/20/2012      Chemistry      Component Value Date/Time   NA 139 10/20/2012 1014   NA 140 02/04/2011 1418   K 3.9 10/20/2012 1014   K 4.0  02/04/2011 1418   CL 104 10/20/2012 1014   CL 106 02/04/2011 1418   CO2 25 10/20/2012 1014   CO2 28 02/04/2011 1418   BUN 19.8 10/20/2012 1014   BUN 20 02/04/2011 1418   CREATININE 0.8 10/20/2012 1014   CREATININE 0.87 02/04/2011 1418      Component Value Date/Time   CALCIUM 8.9 10/20/2012 1014   CALCIUM 9.2 02/04/2011 1418   ALKPHOS 50 10/20/2012 1014   ALKPHOS 39 02/04/2011 1418   AST 17 10/20/2012 1014   AST 16 02/04/2011 1418   ALT 16 10/20/2012 1014   ALT 11 02/04/2011 1418   BILITOT 0.44 10/20/2012 1014   BILITOT 0.4 02/04/2011 1418       RADIOGRAPHIC STUDIES:  No results found.  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 Emelia Loron.  #2 patient is on tamoxifen 20 mg daily as a chemopreventive and she is tolerating this very well.   PLAN:   #1 we will continue tamoxifen on a daily basis for a total of 5 years.  #2 patient knows to call me with any problems questions or concerns.   All questions were answered. The patient knows to call the clinic with any problems, questions or concerns. We can certainly see the patient much sooner if necessary.  I spent 25 minutes counseling the patient face to face. The total time spent in the appointment was 30 minutes.    Drue Second, MD Medical/Oncology [facility] [phone] (beeper) [phone] (Office)  10/20/2012, 11:37 AM

## 2013-03-16 ENCOUNTER — Telehealth: Payer: Self-pay | Admitting: Oncology

## 2013-03-16 NOTE — Telephone Encounter (Signed)
, °

## 2013-05-21 ENCOUNTER — Other Ambulatory Visit: Payer: Self-pay

## 2013-05-21 DIAGNOSIS — Z1231 Encounter for screening mammogram for malignant neoplasm of breast: Secondary | ICD-10-CM

## 2013-06-11 ENCOUNTER — Ambulatory Visit: Payer: Self-pay

## 2013-06-19 IMAGING — MG MM DIGITAL SCREENING BILAT W/ CAD
4 series · 4 of 4 positions shown · non-contrast
Comparison: Previous exams.

CLINICAL DATA: Screening.

DIGITAL BILATERAL SCREENING MAMMOGRAM WITH CAD

[R CC]
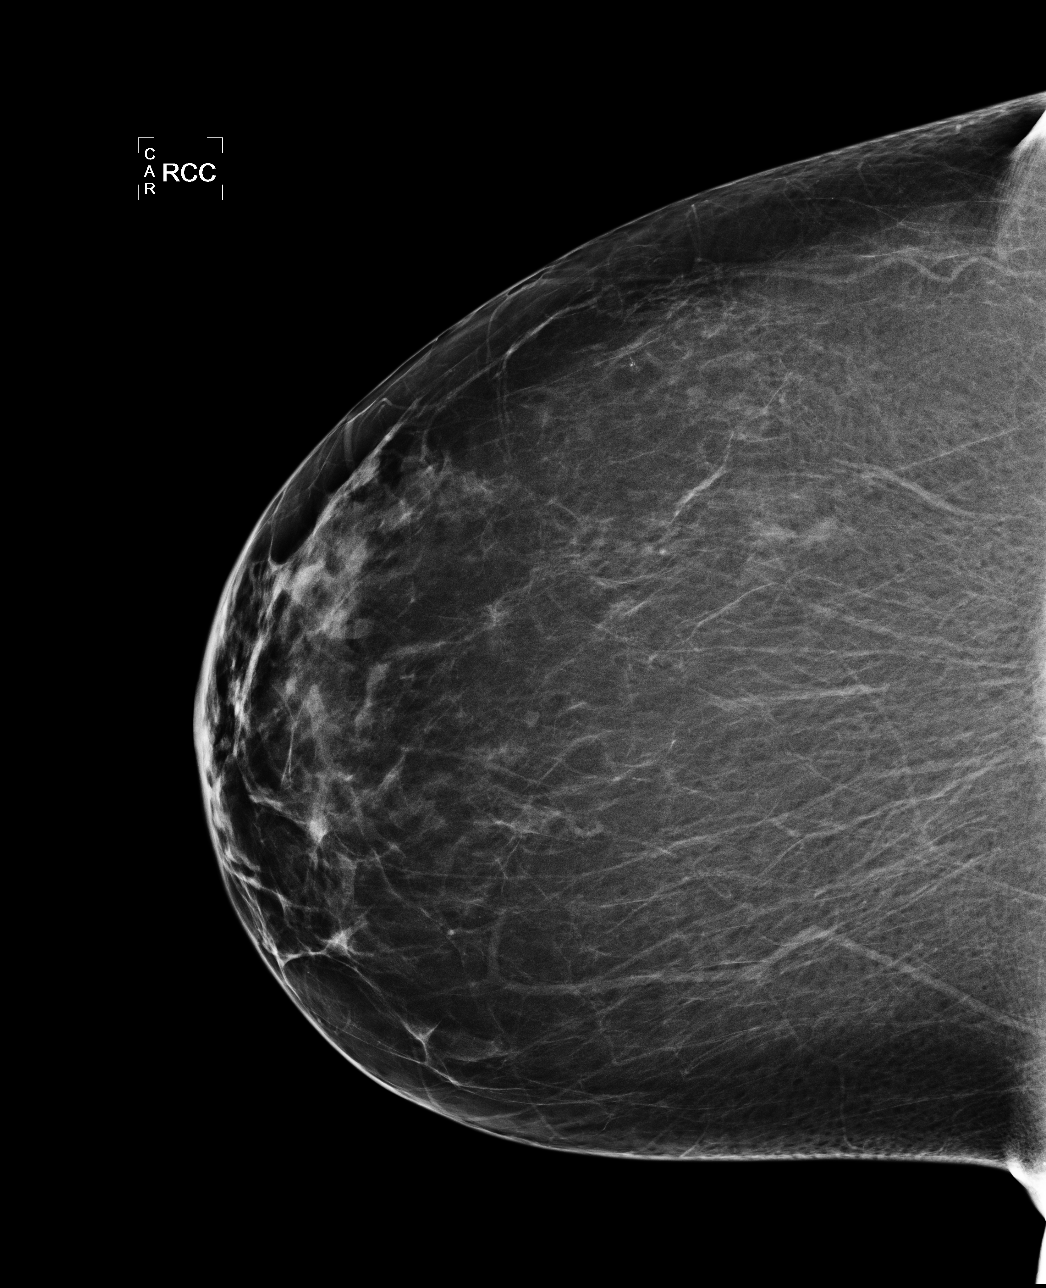

[L CC]
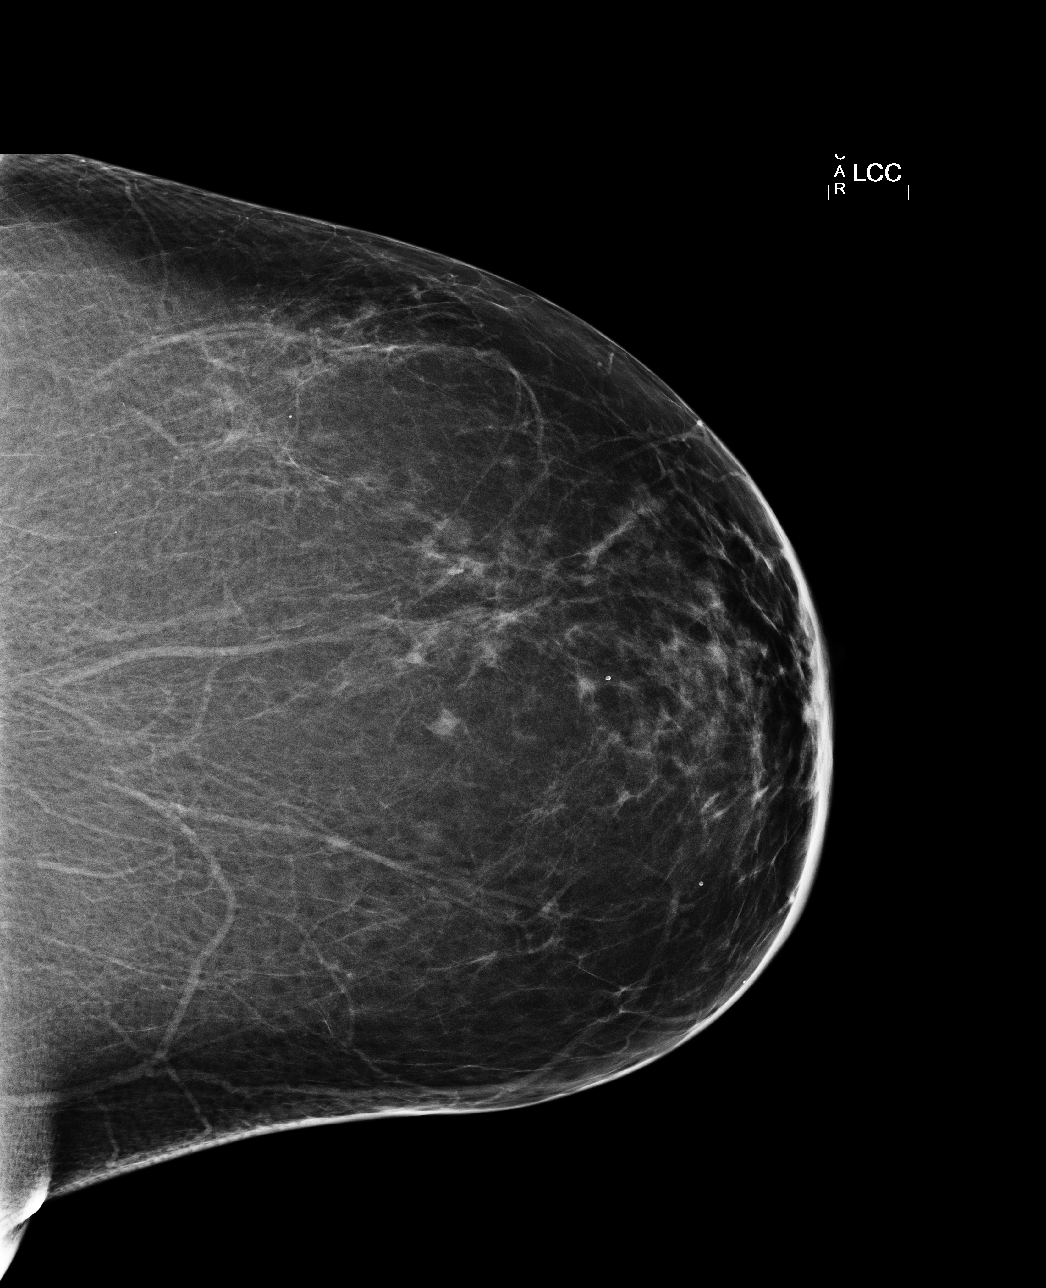

[L MLO]
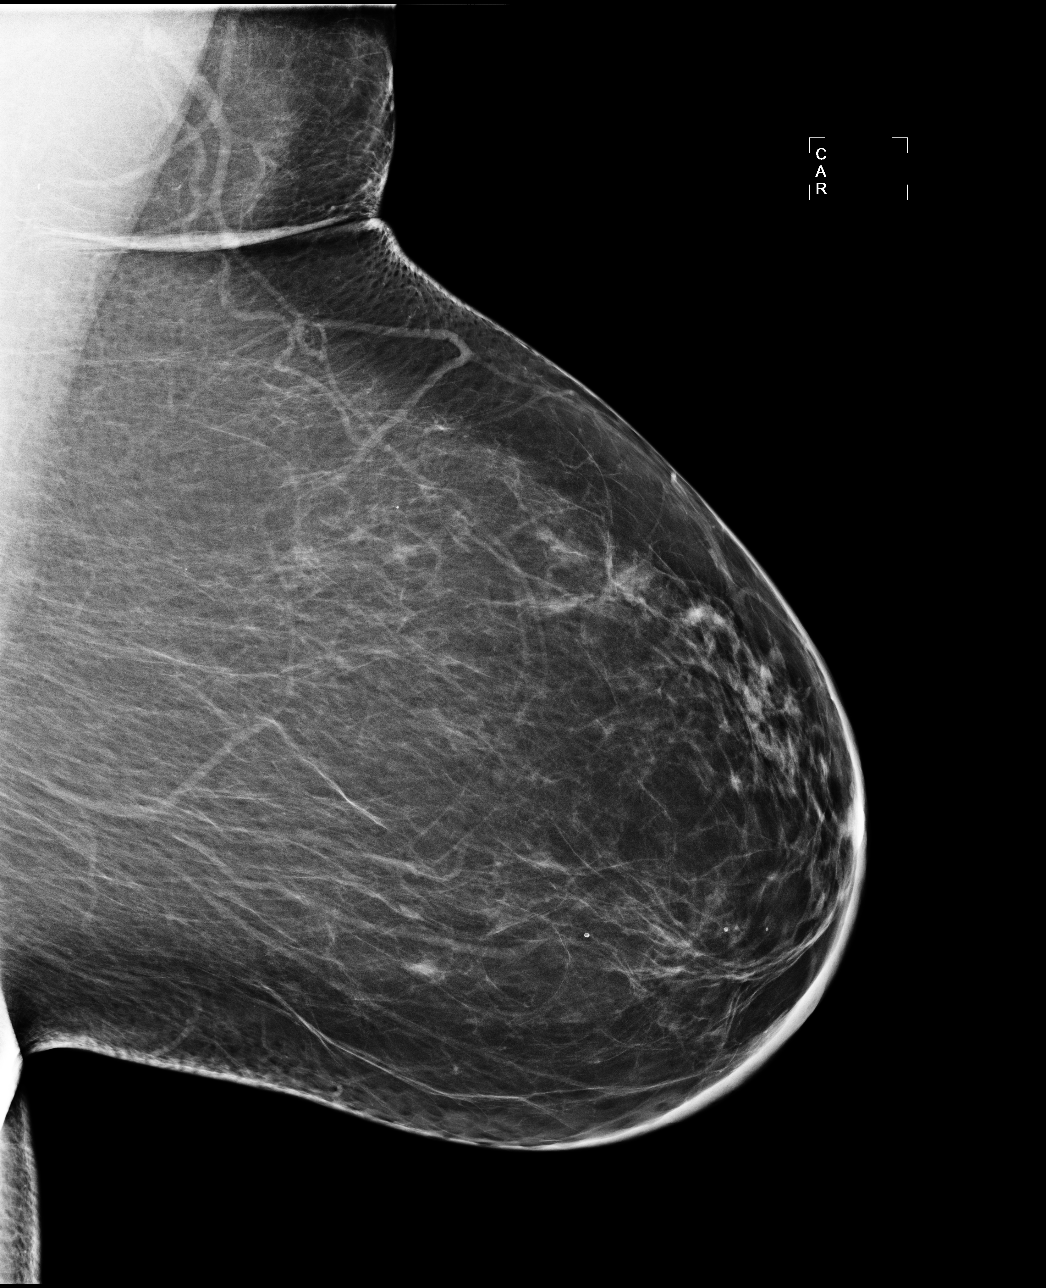

[R MLO]
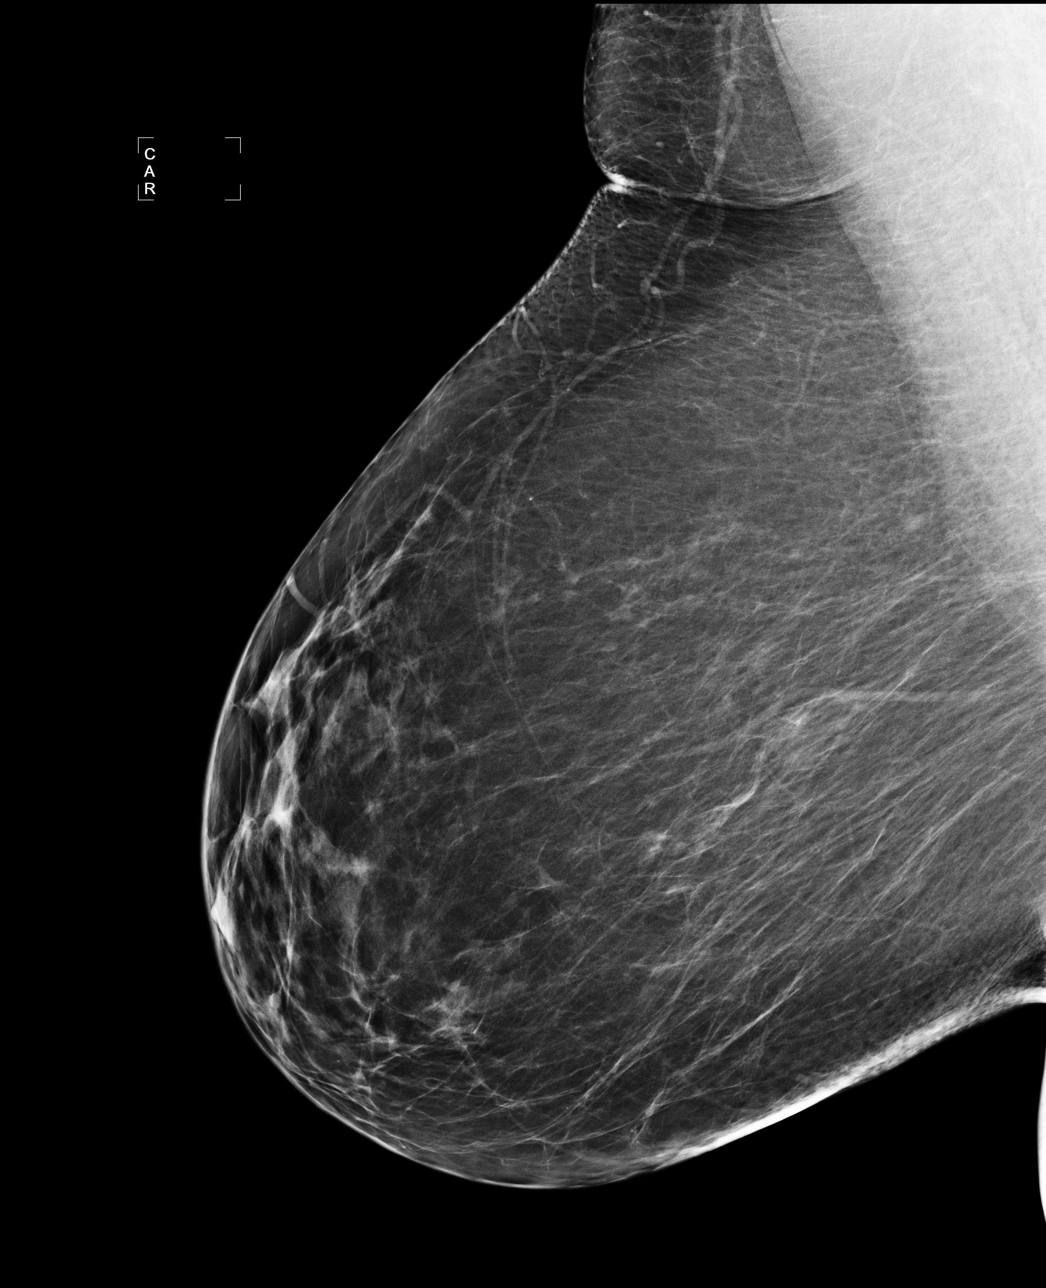

[4 of 4 positions shown; findings below may reference images not displayed]

FINDINGS: There are scattered fibroglandular densities. No
suspicious masses, architectural distortion, or calcifications are
present.

Images were processed with CAD.
IMPRESSION: No mammographic evidence of malignancy.

A result letter of this screening mammogram will be mailed directly
to the patient.

RECOMMENDATION:
Screening mammogram in one year. (Code:FV-O-IDP)

BI-RADS CATEGORY 1:  Negative.

## 2013-10-10 ENCOUNTER — Telehealth: Payer: Self-pay | Admitting: Oncology

## 2013-10-10 NOTE — Telephone Encounter (Signed)
PER PT CX'D TRANSFERRING CARE TO [place]. APPTS FOR 4/17 CXD PER PT.

## 2013-10-19 ENCOUNTER — Ambulatory Visit: Payer: Self-pay | Admitting: Oncology

## 2013-10-19 ENCOUNTER — Other Ambulatory Visit: Payer: Self-pay | Admitting: Lab

## 2013-11-02 ENCOUNTER — Ambulatory Visit: Payer: Self-pay | Admitting: Oncology

## 2013-11-02 ENCOUNTER — Other Ambulatory Visit: Payer: Self-pay
